# Patient Record
Sex: Female | Born: 1960 | Race: White | Hispanic: No | Marital: Married | Smoking: Never smoker
Health system: Southern US, Community
[De-identification: ages and names within clinical notes are randomized; demographics above are authoritative.]

## PROBLEM LIST (undated history)

## (undated) DIAGNOSIS — I10 Essential (primary) hypertension: Secondary | ICD-10-CM

## (undated) DIAGNOSIS — E7800 Pure hypercholesterolemia, unspecified: Secondary | ICD-10-CM

## (undated) HISTORY — PX: DILATION AND CURETTAGE OF UTERUS: SHX78

## (undated) HISTORY — PX: TONSILLECTOMY AND ADENOIDECTOMY: SHX28

## (undated) HISTORY — DX: Pure hypercholesterolemia, unspecified: E78.00

## (undated) HISTORY — DX: Essential (primary) hypertension: I10

---

## 1994-01-05 HISTORY — PX: KNEE SURGERY: SHX244

## 1998-12-02 ENCOUNTER — Other Ambulatory Visit: Admission: RE | Admit: 1998-12-02 | Discharge: 1998-12-02 | Payer: Self-pay | Admitting: Obstetrics and Gynecology

## 1999-12-18 ENCOUNTER — Other Ambulatory Visit: Admission: RE | Admit: 1999-12-18 | Discharge: 1999-12-18 | Payer: Self-pay | Admitting: Obstetrics and Gynecology

## 2001-01-17 ENCOUNTER — Other Ambulatory Visit: Admission: RE | Admit: 2001-01-17 | Discharge: 2001-01-17 | Payer: Self-pay | Admitting: Obstetrics and Gynecology

## 2002-03-20 ENCOUNTER — Other Ambulatory Visit: Admission: RE | Admit: 2002-03-20 | Discharge: 2002-03-20 | Payer: Self-pay | Admitting: Obstetrics and Gynecology

## 2003-03-19 ENCOUNTER — Other Ambulatory Visit: Admission: RE | Admit: 2003-03-19 | Discharge: 2003-03-19 | Payer: Self-pay | Admitting: Obstetrics and Gynecology

## 2004-03-20 ENCOUNTER — Other Ambulatory Visit: Admission: RE | Admit: 2004-03-20 | Discharge: 2004-03-20 | Payer: Self-pay | Admitting: Obstetrics and Gynecology

## 2007-11-11 ENCOUNTER — Ambulatory Visit: Admission: AD | Admit: 2007-11-11 | Discharge: 2007-11-11 | Payer: Self-pay | Admitting: Obstetrics and Gynecology

## 2007-11-11 ENCOUNTER — Encounter (INDEPENDENT_AMBULATORY_CARE_PROVIDER_SITE_OTHER): Payer: Self-pay | Admitting: Obstetrics and Gynecology

## 2008-04-23 ENCOUNTER — Encounter: Admission: RE | Admit: 2008-04-23 | Discharge: 2008-04-23 | Payer: Self-pay | Admitting: Obstetrics and Gynecology

## 2010-01-26 ENCOUNTER — Encounter: Payer: Self-pay | Admitting: Obstetrics and Gynecology

## 2010-04-04 ENCOUNTER — Other Ambulatory Visit: Payer: Self-pay | Admitting: Obstetrics and Gynecology

## 2010-05-19 ENCOUNTER — Encounter: Payer: Self-pay | Admitting: Genetic Counselor

## 2010-05-20 NOTE — Op Note (Signed)
NAMESHEILIA, Cindy Barr              ACCOUNT NO.:  [id number]   MEDICAL RECORD NO.:  [id number]          PATIENT TYPE:  AMB   LOCATION:  [facility]                          FACILITY:  [facility]   PHYSICIAN:  Kendra H. Tenny Craw, MD     DATE OF BIRTH:  Feb 04, 1960   DATE OF PROCEDURE:  11/11/2007  DATE OF DISCHARGE:                               OPERATIVE REPORT   PREOPERATIVE DIAGNOSES:  1. Menorrhagia.  2. Heterogeneous thickened endometrial lining measuring 2.4 cm on      ultrasound.   POSTOPERATIVE DIAGNOSES:  1. Menorrhagia.  2. Heterogeneous thickened endometrial lining measuring 2.4 cm on      ultrasound.   PROCEDURE:  Dilation and curettage.   SURGEON:  Freddrick March. Tenny Craw, MD   ASSISTANT:  None.   ANESTHESIA:  MAC.   OPERATIVE FINDINGS:  Moderate amount of endometrial curettings.  Both  pink and yellow in color.   DISPOSITION:  Specimens to pathology.   ESTIMATED BLOOD LOSS:  Minimal.   COMPLICATIONS:  None.   PROCEDURE:  Cindy Barr is a 50 year old G2, P2 who presents with a  several year history of irregular menstrual bleeding.  She had not  experienced menstrual cycle for 74 days until Sunday of last week when  she started having irregular bleeding which at times was heavy with  passage of large clots.  This persisted throughout the week and today  she presented with concern for worsening bleeding.  A transvaginal  ultrasound was performed in the office which demonstrated a  heterogeneously thickened endometrial lining at its thickest point  measured 2.4 cm with a nonuniform appearance.  No enhancement with  Doppler was noted.  Given the continued bleeding and the abnormally  thickened uterine lining, the decision was made to proceed with dilation  and curettage for diagnostic and therapeutic purposes.  Following the  appropriate informed consent, the patient was brought to the operating  room where MAC anesthesia was administered and found to be adequate.  She was placed in  the dorsal lithotomy position, prepped and draped in  the normal sterile fashion.  A speculum was placed in the vagina.  A  single-toothed tenaculum was used to grasp the cervix and 10 mL of 1%  lidocaine was infiltrated into the cervix in a paracervical fashion.  The cervix was then serially dilated and several sharp curettage passes  were performed until a gritty texture was noted.  No irregular contours  of the intrauterine cavity were noted.  Endometrial curettings  demonstrated both yellow somewhat necrotic-appearing endometrium and  pink endometrium.  These were sent for pathologic evaluation.  This  completed the surgical procedure.  A single-tooth tenaculum was removed  from the anterior lip of  the cervix.  The speculum was removed from the vagina.  The patient was  taken out of the lithotomy position.  Anesthesia was reversed and the  patient was brought to the recovery room in stable condition following  the procedure.      Freddrick March. Tenny Craw, MD  Electronically Signed     KHR/MEDQ  D:  11/11/2007  T:  11/12/2007  Job:  [id number]

## 2010-10-07 LAB — TYPE AND SCREEN
ABO/RH(D): O POS
Antibody Screen: NEGATIVE

## 2010-10-07 LAB — CBC
Hemoglobin: 13.3
RBC: 4.43

## 2010-10-07 LAB — PROTIME-INR: INR: 1

## 2010-10-07 LAB — APTT: aPTT: 30

## 2012-05-04 ENCOUNTER — Other Ambulatory Visit: Payer: Self-pay | Admitting: Obstetrics and Gynecology

## 2013-05-17 ENCOUNTER — Other Ambulatory Visit: Payer: Self-pay | Admitting: Obstetrics and Gynecology

## 2013-05-24 ENCOUNTER — Other Ambulatory Visit: Payer: Self-pay | Admitting: Obstetrics and Gynecology

## 2013-05-24 DIAGNOSIS — R928 Other abnormal and inconclusive findings on diagnostic imaging of breast: Secondary | ICD-10-CM

## 2013-06-05 ENCOUNTER — Ambulatory Visit
Admission: RE | Admit: 2013-06-05 | Discharge: 2013-06-05 | Disposition: A | Discharge status: Home / Self Care | Payer: BC Managed Care – PPO | Source: Ambulatory Visit | Attending: Obstetrics and Gynecology | Admitting: Obstetrics and Gynecology

## 2013-06-05 ENCOUNTER — Other Ambulatory Visit: Payer: Self-pay | Admitting: Obstetrics and Gynecology

## 2013-06-05 ENCOUNTER — Encounter (INDEPENDENT_AMBULATORY_CARE_PROVIDER_SITE_OTHER): Payer: Self-pay

## 2013-06-05 DIAGNOSIS — R928 Other abnormal and inconclusive findings on diagnostic imaging of breast: Secondary | ICD-10-CM

## 2014-03-06 ENCOUNTER — Other Ambulatory Visit: Payer: Self-pay | Admitting: Obstetrics and Gynecology

## 2014-03-13 ENCOUNTER — Telehealth: Payer: Self-pay | Admitting: *Deleted

## 2014-03-13 NOTE — Telephone Encounter (Signed)
[facility]ard Reeds[facility]endra Ross office [facility] for new pt appt Friday 3/11 at 0945. Request call pt and call back to our office to confirm message received.

## 2014-03-16 ENCOUNTER — Ambulatory Visit: Payer: BLUE CROSS/BLUE SHIELD | Attending: Gynecology | Admitting: Gynecology

## 2014-03-16 ENCOUNTER — Encounter: Payer: Self-pay | Admitting: Gynecology

## 2014-03-16 VITALS — BP 150/81 | HR 75 | Temp 98.3°F | Resp 18 | Ht 59.0 in | Wt 115.9 lb

## 2014-03-16 DIAGNOSIS — Z79899 Other long term (current) drug therapy: Secondary | ICD-10-CM | POA: Insufficient documentation

## 2014-03-16 DIAGNOSIS — Z880 Allergy status to penicillin: Secondary | ICD-10-CM | POA: Insufficient documentation

## 2014-03-16 DIAGNOSIS — E780 Pure hypercholesterolemia: Secondary | ICD-10-CM | POA: Insufficient documentation

## 2014-03-16 DIAGNOSIS — Z881 Allergy status to other antibiotic agents status: Secondary | ICD-10-CM | POA: Diagnosis not present

## 2014-03-16 DIAGNOSIS — Z7982 Long term (current) use of aspirin: Secondary | ICD-10-CM | POA: Diagnosis not present

## 2014-03-16 DIAGNOSIS — I10 Essential (primary) hypertension: Secondary | ICD-10-CM | POA: Diagnosis not present

## 2014-03-16 DIAGNOSIS — Z8041 Family history of malignant neoplasm of ovary: Secondary | ICD-10-CM | POA: Diagnosis not present

## 2014-03-16 DIAGNOSIS — N8320 Unspecified ovarian cysts: Secondary | ICD-10-CM | POA: Diagnosis not present

## 2014-03-16 DIAGNOSIS — N83209 Unspecified ovarian cyst, unspecified side: Secondary | ICD-10-CM | POA: Insufficient documentation

## 2014-03-16 DIAGNOSIS — R971 Elevated cancer antigen 125 [CA 125]: Secondary | ICD-10-CM | POA: Diagnosis not present

## 2014-03-16 DIAGNOSIS — N83201 Unspecified ovarian cyst, right side: Secondary | ICD-10-CM

## 2014-03-16 DIAGNOSIS — Z803 Family history of malignant neoplasm of breast: Secondary | ICD-10-CM | POA: Diagnosis not present

## 2014-03-16 NOTE — Progress Notes (Signed)
Consult Note: Gyn-Onc   Cindy Barr 54 y.o. female  Chief Complaint  Patient presents with  . Ovarian Cyst    Assessment : Small but enlarging left ovarian cyst with thick wall and flow. Further, CA-125 is risen from 38 units per mL 251 units per mL. To my opinion that we need to be certain that this does not represent a small early malignancy of the ovary.  Plan: I recommend the patient undergo laparoscopic left salpingo-oophorectomy with intraoperative frozen section. Should this be benign then no further surgery would be necessary. On the other hand, if this is a malignancy the patient and her husband wish to proceed with exploratory laparotomy and surgical staging under the same anesthesia. The extensive surgery in this situation includes total abdominal hysterectomy bilateral salpingo-oophorectomy omentectomy and lymphadenectomy. The risks of surgery were outlined including hemorrhage, infection, injury to adjacent viscera, thrombolic comp locations, and anesthetic risks. Patient her husband are in agreement with this plan and all questions are answered.  The patient wishes to proceed with surgery as soon as possible and we will therefore schedule it to be performed at [facility] on 03/20/2014.  HPI: 54 year old white married female seen in consultation request of Dr. Waynard Reeds regarding management of a left ovarian cyst. The patient initially presented with postmenopausal bleeding and in the course of evaluation an ultrasound was performed on 02/13/2014. Small bilateral simple ovarian cysts were identified. CA-125 at that time was 38 units per mL. A follow-up ultrasound on 03/06/2014 showed that the left ovarian cyst had increased in size now measuring 3.4 x 1.9 x 2.9 cm. Importantly, septations were thickened and there was flow in the septation. CA-125 also was elevated at 51 units per mL. (CEA was normal at 2.5). Endometrial biopsy revealed endometrial glands with stroma and  features of atrophy.  The patient denies any abdominal discomfort and bloating or pressure. She has a family history revealing a mother with breast cancer and a maternal aunt with ovarian cancer. Obstetrical history gravida 2.  Review of Systems:10 point review of systems is negative except as noted in interval history.   Vitals: Blood pressure 150/81, pulse 75, temperature 98.3 F (36.8 C), temperature source Oral, resp. rate 18, height  (1.499 m), weight 115 lb 14.4 oz (52.572 kg).  Physical Exam: General : The patient is a healthy woman in no acute distress.  HEENT: normocephalic, extraoccular movements normal; neck is supple without thyromegally  Lynphnodes: Supraclavicular and inguinal nodes not enlarged  Abdomen: Soft, non-tender, no ascites, no organomegally, no masses, no hernias  Pelvic:  EGBUS: Normal female  Vagina: Normal, no lesions  Urethra and Bladder: Normal, non-tender  Cervix: Normal there is no bleeding today. Uterus: Anterior normal shape size and consistency. Bi-manual examination: Non-tender; no adenxal masses or nodularity  Rectal: normal sphincter tone, no masses, no blood  Lower extremities: No edema or varicosities. Normal range of motion      Allergies  Allergen Reactions  . Amoxicillin Nausea Only  . Penicillins Nausea Only  . Sulfa Antibiotics Nausea Only    Past Medical History  Diagnosis Date  . Hypertension   . High cholesterol     Past Surgical History  Procedure Laterality Date  . Tonsillectomy and adenoidectomy    . Dilation and curettage of uterus    . Knee surgery Left 1996    Current Outpatient Prescriptions  Medication Sig Dispense Refill  . Aspirin-Acetaminophen-Caffeine (EXCEDRIN PO) Take 1 tablet by mouth as needed.    Marland Kitchen  atorvastatin (LIPITOR) 10 MG tablet Take 10 mg by mouth daily at 6 PM.    . busPIRone (BUSPAR) 15 MG tablet Take 15 mg by mouth 2 (two) times daily.     Marland Kitchen. Fexofenadine HCl (MUCINEX ALLERGY PO) Take 1  tablet by mouth as needed.    . nisoldipine (SULAR) 17 MG 24 hr tablet Take 17 mg by mouth daily.      No current facility-administered medications for this visit.    History   Social History  . Marital Status: Married    Spouse Name: N/A  . Number of Children: N/A  . Years of Education: N/A   Occupational History  . Not on file.   Social History Main Topics  . Smoking status: Never Smoker   . Smokeless tobacco: Not on file  . Alcohol Use: Yes     Comment: wine - once a week  . Drug Use: No  . Sexual Activity: Not Currently   Other Topics Concern  . Not on file   Social History Narrative  . No narrative on file    Family History  Problem Relation Age of Onset  . Lung cancer Mother   . Breast cancer Mother   . Ovarian cancer Maternal Aunt   . Colon cancer Paternal Grandfather       Jeannette CorpusLARKE-PEARSON,Anwita Mencer L, MD 03/16/2014, 10:07 AM

## 2014-03-16 NOTE — Patient Instructions (Signed)
[facility] with Dr. Stanford Breedlarke-Pearson. We will call you with definite date for surgery. [facility]t prior to surgery.

## 2014-04-20 ENCOUNTER — Ambulatory Visit: Payer: BLUE CROSS/BLUE SHIELD | Attending: Gynecology | Admitting: Gynecology

## 2014-04-20 DIAGNOSIS — N801 Endometriosis of ovary: Secondary | ICD-10-CM | POA: Diagnosis not present

## 2014-04-20 DIAGNOSIS — N83201 Unspecified ovarian cyst, right side: Secondary | ICD-10-CM

## 2014-04-20 DIAGNOSIS — N80109 Endometriosis of ovary, unspecified side, unspecified depth: Secondary | ICD-10-CM

## 2014-04-20 DIAGNOSIS — N8320 Unspecified ovarian cysts: Secondary | ICD-10-CM

## 2014-04-20 NOTE — Patient Instructions (Signed)
You may return to full levels of activity. Please return to the care of Dr. Tenny Crawoss for gynecologic care.

## 2014-04-20 NOTE — Progress Notes (Signed)
Consult Note: Gyn-Onc   Cindy Barr A Riviello 54 y.o. female  Chief Complaint  Patient presents with  . Cyst right ovary    Assessment : Status post laparoscopic bilateral salpingo-oophorectomy for benign ovarian cyst, small Brenner tumor, and endometriosis. Patient's had an incompetent postoperative recovery and is given the okay to return to full levels of activity. She will return to the care of her primary gynecologist Dr. Tenny Crawoss for continuing gynecologic management.  HPI: 54 year old white married female seen in consultation request of Dr. Waynard ReedsKendra Ross regarding management of a left ovarian cyst. The patient initially presented with postmenopausal bleeding and in the course of evaluation an ultrasound was performed on 02/13/2014. Small bilateral simple ovarian cysts were identified. CA-125 at that time was 38 units per mL. A follow-up ultrasound on 03/06/2014 showed that the left ovarian cyst had increased in size now measuring 3.4 x 1.9 x 2.9 cm. Importantly, septations were thickened and there was flow in the septation. CA-125 also was elevated at 51 units per mL. (CEA was normal at 2.5). Endometrial biopsy revealed endometrial glands with stroma and features of atrophy.  The patient denies any abdominal discomfort and bloating or pressure. She has a family history revealing a mother with breast cancer and a maternal aunt with ovarian cancer. Obstetrical history gravida 2.  Patient [facility] Her postoperative course was uncomplicated. Final pathology revealed a benign ovarian cyst, small Brenner tumor, and endometriosis.     Review of Systems:10 point review of systems is negative except as noted in interval history.   Vitals: There were no vitals taken for this visit.  Physical Exam: General : The patient is a healthy woman in no acute distress.  HEENT: normocephalic, extraoccular movements normal; neck is supple without  thyromegally  Lynphnodes: Supraclavicular and inguinal nodes not enlarged  Abdomen: Soft, non-tender, no ascites, no organomegally, no masses, no hernias, Steri-Strips removed and all incisions are healing well. Pelvic:  Deferred  Lower extremities: No edema or varicosities. Normal range of motion      Allergies  Allergen Reactions  . Amoxicillin Nausea Only  . Penicillins Nausea Only  . Sulfa Antibiotics Nausea Only    Past Medical History  Diagnosis Date  . Hypertension   . High cholesterol     Past Surgical History  Procedure Laterality Date  . Tonsillectomy and adenoidectomy    . Dilation and curettage of uterus    . Knee surgery Left 1996    Current Outpatient Prescriptions  Medication Sig Dispense Refill  . docusate sodium (COLACE) 100 MG capsule Take 100 mg by mouth.    Marland Kitchen. ibuprofen (ADVIL,MOTRIN) 800 MG tablet Take 800 mg by mouth.    . oxyCODONE-acetaminophen (PERCOCET/ROXICET) 5-325 MG per tablet 1-2 tabs PO q4-6 hours prn pain    . Aspirin-Acetaminophen-Caffeine (EXCEDRIN PO) Take 1 tablet by mouth as needed.    Marland Kitchen. atorvastatin (LIPITOR) 10 MG tablet Take 10 mg by mouth daily at 6 PM.    . busPIRone (BUSPAR) 15 MG tablet Take 15 mg by mouth 2 (two) times daily.     Marland Kitchen. Fexofenadine HCl (MUCINEX ALLERGY PO) Take 1 tablet by mouth as needed.    Marland Kitchen. ibuprofen (ADVIL,MOTRIN) 800 MG tablet   0  . nisoldipine (SULAR) 17 MG 24 hr tablet Take 17 mg by mouth daily.      No current facility-administered medications for this visit.    History   Social History  . Marital Status: Married  Spouse Name: N/A  . Number of Children: N/A  . Years of Education: N/A   Occupational History  . Not on file.   Social History Main Topics  . Smoking status: Never Smoker   . Smokeless tobacco: Not on file  . Alcohol Use: Yes     Comment: wine - once a week  . Drug Use: No  . Sexual Activity: Not Currently   Other Topics Concern  . Not on file   Social History Narrative   . No narrative on file    Family History  Problem Relation Age of Onset  . Lung cancer Mother   . Breast cancer Mother   . Ovarian cancer Maternal Aunt   . Colon cancer Paternal Grandfather       Jeannette Corpus, MD 04/20/2014, 1:48 PM

## 2014-10-11 ENCOUNTER — Ambulatory Visit: Payer: BLUE CROSS/BLUE SHIELD | Admitting: Dietician

## 2014-11-06 ENCOUNTER — Encounter: Payer: BLUE CROSS/BLUE SHIELD | Attending: Family Medicine | Admitting: Dietician

## 2014-11-06 ENCOUNTER — Encounter: Payer: Self-pay | Admitting: Dietician

## 2014-11-06 VITALS — Ht 60.0 in | Wt 113.2 lb

## 2014-11-06 DIAGNOSIS — R7303 Prediabetes: Secondary | ICD-10-CM | POA: Insufficient documentation

## 2014-11-06 DIAGNOSIS — Z713 Dietary counseling and surveillance: Secondary | ICD-10-CM | POA: Diagnosis not present

## 2014-11-06 NOTE — Progress Notes (Signed)
  Medical Nutrition Therapy:  Appt start time: 305 end time:  415  Assessment:  Primary concerns today: [name] states that she was referred for prediabetes. HgbA1c 6.4%. She reports that diabetes runs in her family. [name] states that she has always exercised and maintained a healthy weight and tries to eat a balanced diet. She feels like the stress that she is feeling in her personal life is contributing to high blood sugars.   Preferred Learning Style:   No preference indicated   Learning Readiness:   Ready   MEDICATIONS: see list   DIETARY INTAKE:  Avoided foods include raw onions, anchovies  24-hr recall:  Snk (AM): cashews and a kiwi and DanActive yogurt drink and vitamins B ( AM): [place] yogurt, banana, unsweetened applesauce, cinnamon, sometimes with honey or cereal, and rinsed fruit cup Snk ( AM):  Sometimes dehydrated fruit L ( PM): salad with chicken, salmon or chicken salad sandwich Snk ( PM): sometimes dehydrated fruit D ( PM): *see lunch Snk ( PM):   Beverages: wine on weekends, water "all the time," decaf peppermint and green tea with honey (working on having less honey)   Usual physical activity: yoga 2x a week, pilates 1x a week, active overall  Estimated energy needs: 1600-1800 calories 180-200 g carbohydrates 120-135 g protein 44-50 g fat  Progress Towards Goal(s):  In progress.   Nutritional Diagnosis:  Nesbitt-2.2 Altered nutrition-related laboratory As related to family history of type 2 diabetes and excessive carbohydrate intake.  As evidenced by HgbA1c 6.4%.    Intervention:  Nutrition counseling provided. Reviewed macronutrient metabolism. Explained food label with emphasis on serving size, total carbohydrate, sugar, and fiber. Encouraged protein foods with each meal and snack. Praised patient on regular meal pattern and exercise routine.  Teaching Method Utilized:  Visual Auditory Hands on  Handouts given during visit include:  Meal planning  card  Barriers to learning/adherence to lifestyle change: stress  Demonstrated degree of understanding via:  Teach Back   Monitoring/Evaluation:  Dietary intake, exercise, labs, and body weight prn.

## 2014-11-06 NOTE — Patient Instructions (Signed)
-  Include a protein food with most meals and snacks -Continue your exercise routine

## 2014-11-07 ENCOUNTER — Encounter: Payer: Self-pay | Admitting: Dietician

## 2015-03-08 ENCOUNTER — Other Ambulatory Visit: Payer: Self-pay | Admitting: Obstetrics and Gynecology

## 2015-03-12 LAB — CYTOLOGY - PAP

## 2015-05-08 IMAGING — MG MM DIAGNOSTIC UNILATERAL R
2 series · 2 of 2 positions shown · non-contrast
Comparison: With priors.

CLINICAL DATA: Abnormal right screening mammogram.

EXAM:
DIGITAL DIAGNOSTIC  RIGHT MAMMOGRAM
ULTRASOUND RIGHT BREAST

[R CC]
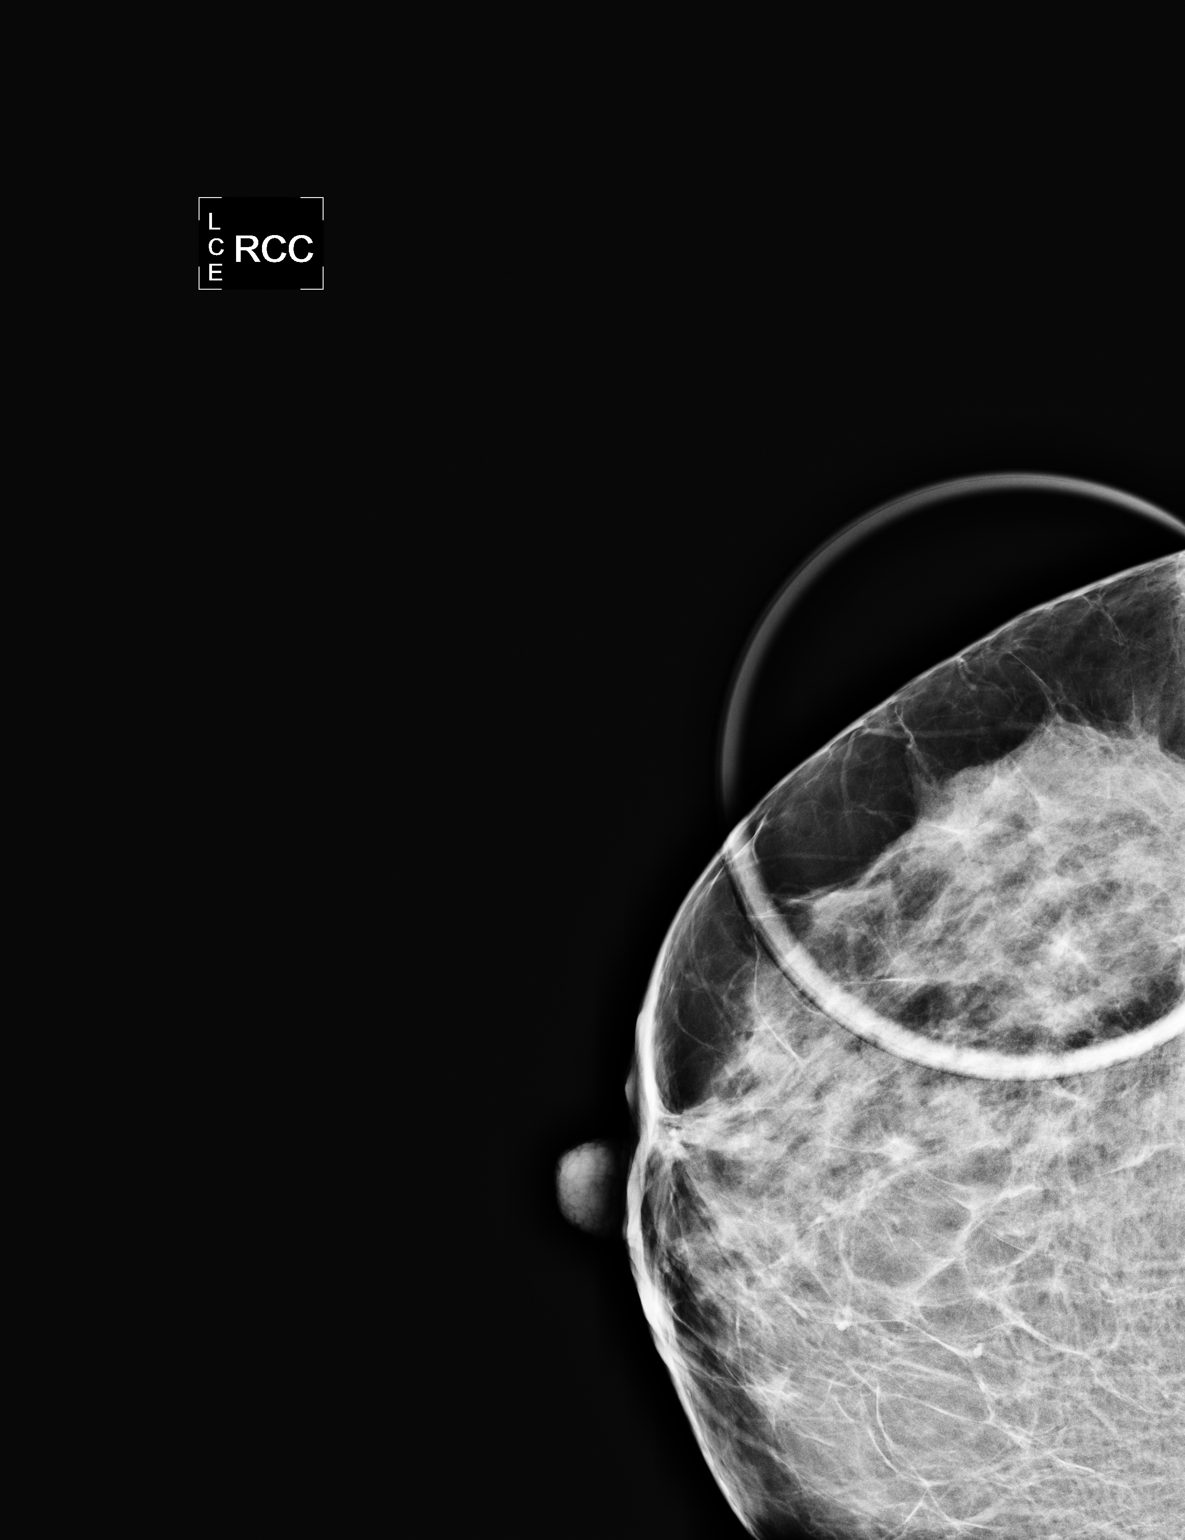

[R MLO]
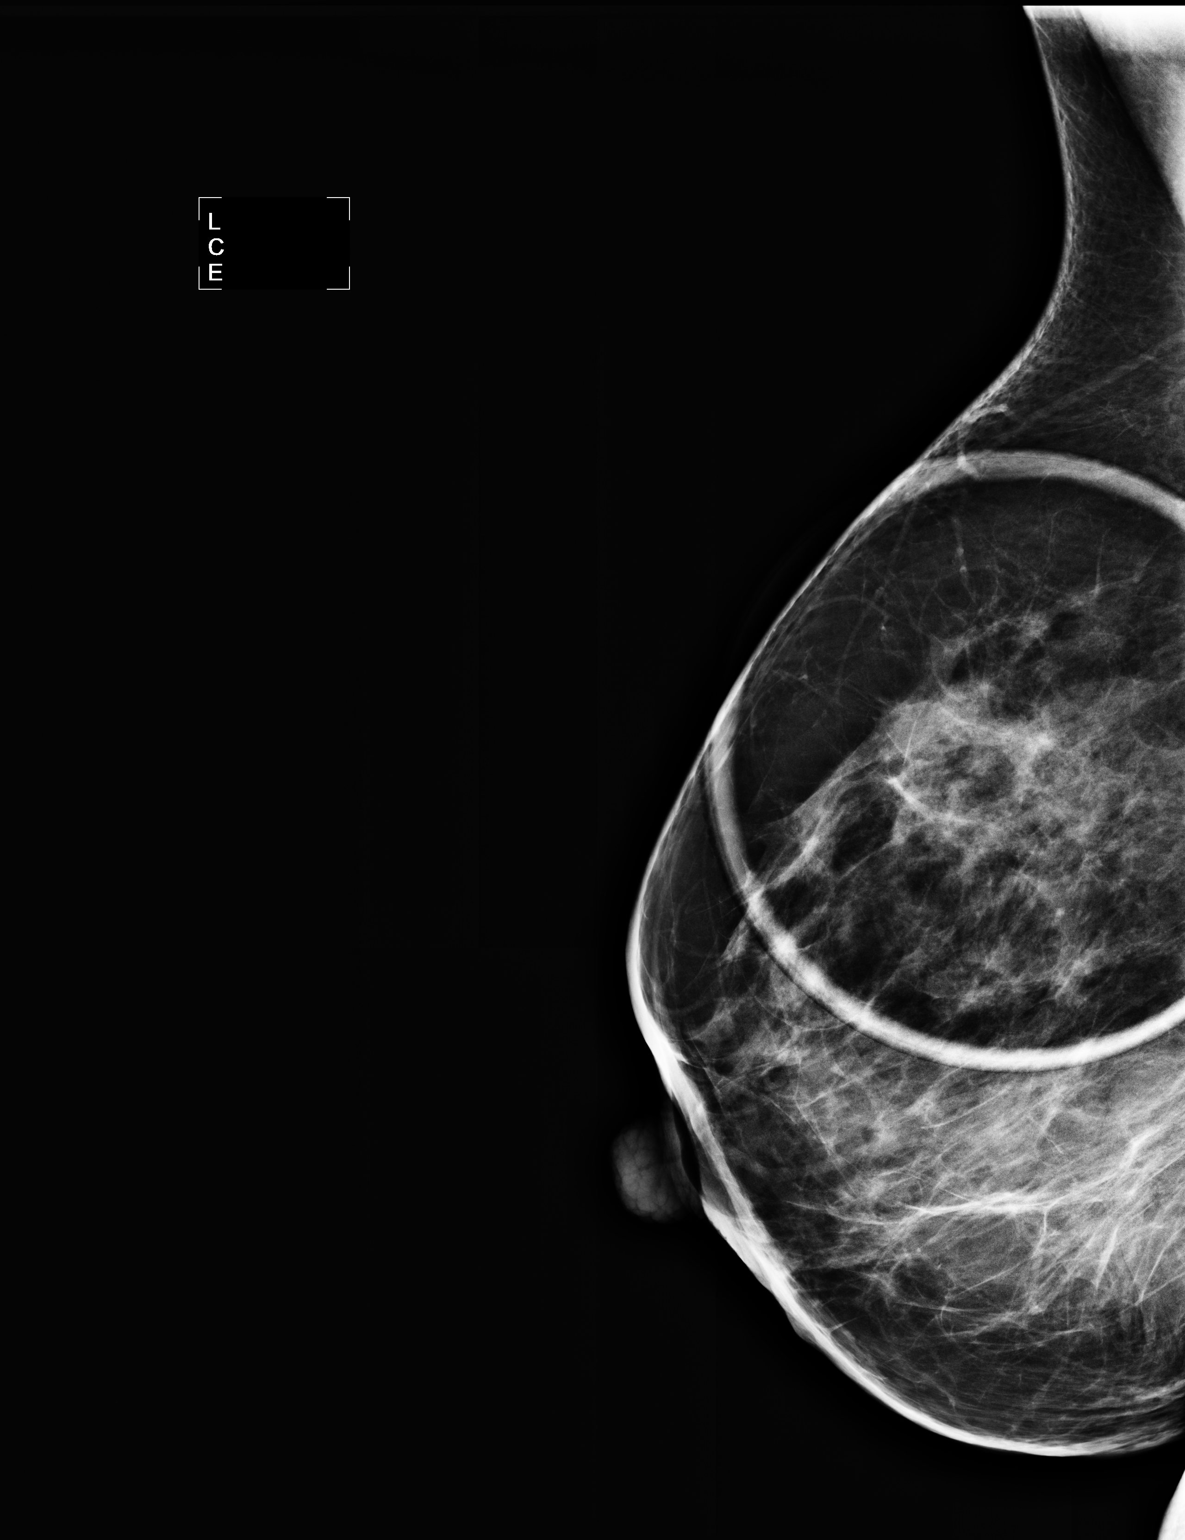

[2 of 2 positions shown; findings below may reference images not displayed]

ACR Breast Density Category b: There are scattered areas of
fibroglandular density.
FINDINGS: Spot compression views of the upper-outer quadrant of the right
breast were obtained. The parenchymal pattern is stable from the
prior exams. No suspicious mass or malignant type
microcalcifications identified.

On physical exam, I do not palpate a mass in the right breast.

Ultrasound is performed, showing normal tissue in the upper-outer
quadrant of right breast. No solid or cystic mass, abnormal
shadowing or distortion detected.
IMPRESSION: No evidence of malignancy in the right breast.

RECOMMENDATION:
Bilateral screening mammogram in 1 year is recommended.

I have discussed the findings and recommendations with the patient.
Results were also provided in writing at the conclusion of the
visit. If applicable, a reminder letter will be sent to the patient
regarding the next appointment.

BI-RADS CATEGORY  1: Negative.

## 2016-03-11 ENCOUNTER — Other Ambulatory Visit: Payer: Self-pay | Admitting: Obstetrics and Gynecology

## 2016-03-12 LAB — CYTOLOGY - PAP

## 2021-10-13 ENCOUNTER — Ambulatory Visit: Payer: 59 | Admitting: Orthopedic Surgery

## 2021-10-13 ENCOUNTER — Encounter: Payer: Self-pay | Admitting: Orthopedic Surgery

## 2021-10-13 ENCOUNTER — Ambulatory Visit (INDEPENDENT_AMBULATORY_CARE_PROVIDER_SITE_OTHER): Payer: 59

## 2021-10-13 VITALS — BP 161/73 | HR 87 | Ht 60.0 in | Wt 107.0 lb

## 2021-10-13 DIAGNOSIS — M545 Low back pain, unspecified: Secondary | ICD-10-CM

## 2021-10-13 DIAGNOSIS — M5416 Radiculopathy, lumbar region: Secondary | ICD-10-CM

## 2021-10-13 NOTE — Progress Notes (Signed)
Orthopedic Spine Surgery Office Note  Assessment: Patient is a 61 y.o. female with chronic low back pain and radicular left leg pain that seems to be in a L3 distribution and has acutely worsening within the last couple of weeks   Plan: -Explained that initially conservative treatment is tried as a significant number of patients may experience relief with these treatment modalities. Discussed that the conservative treatments include:  -activity modification  -physical therapy  -over the counter pain medications  -medrol dosepak  -lumbar steroid injections -Patient has tried tizanidine, diclofenac, gabapentin, activity modification -She can continue with the medication she was previously prescribed -Recommended a course of physical therapy.  A referral was provided to her today. -Patient should return to office in 6 weeks, repeat x-rays of lumbar spine at next visit: None   Patient expressed understanding of the plan and all questions were answered to their satisfaction.   ___________________________________________________________________________   History:  Patient is a 61 y.o. female who presents today for lumbar spine.  Patient reports a fall while walking about 3 weeks ago.  She states that she was able to get up and continue with her activities without any issue.  She then was working with some potted plants about 2 weeks ago doing a lot of lifting and twisting but did not have any pain at that time.  She was working with the potted plants on Tuesday and then she states on Friday she noticed low back pain that radiated into her left leg.  She felt that radiate into the anterior thigh.  It never radiated past the knee.  She had no symptoms on the right side.  She has had similar pain for a long time, but it has gotten significant worse in terms of severity of pain within the last 2 weeks. She went to an urgent care where she tells me that kidney stones were ruled out since she has a  history of kidney stones. She was prescribed several medications at that urgent care visit.  These medications seem to be helping.  She is feeling much better today as opposed to a couple days ago.   Weakness: Denies Symptoms of imbalance: Denies Paresthesias and numbness: Denies Bowel or bladder incontinence: Denies Saddle anesthesia: Denies  Treatments tried: Diclofenac, tizanidine, activity modification, gabapentin  Review of systems: Denies fevers and chills, night sweats, unexplained weight loss, history of cancer. Has had pain in her lower back that radiates into the left left leg but that has improved.   Past medical history: Hyperlipidemia HTN Anxiety Diabetes Kidney stones  Allergies: Sulfa, penicillins  Past surgical history:  Tonsillectomy Wisdom teeth extraction Knee surgery Salpingo-oophorectomy  Social history: Denies use of nicotine product (smoking, vaping, patches, smokeless) Alcohol use: Yes, 3/week Denies recreational drug use   Physical Exam:  General: no acute distress, appears stated age Neurologic: alert, answering questions appropriately, following commands Respiratory: unlabored breathing on room air, symmetric chest rise Psychiatric: appropriate affect, normal cadence to speech   MSK (spine):  -Strength exam      Left  Right EHL    5/5  5/5 TA    5/5  5/5 GSC    5/5  5/5 Knee extension  5/5  5/5 Hip flexion   5/5  5/5  -Sensory exam    Sensation intact to light touch in L3-S1 nerve distributions of bilateral lower extremities  -Achilles DTR: 2/4 on the left, 2/4 on the right -Patellar tendon DTR: 2/4 on the left, 2/4 on the right  -  Straight leg raise: Negative -Contralateral straight leg raise: Negative -Femoral nerve stretch test: Negative -Clonus: no beats bilaterally  -Left hip exam: Negative Stinchfield, no pain through full range of motion, negative Pearlean Brownie, negative FADIR -Right hip exam: Negative Stinchfield, no pain  through full range of motion, negative Pearlean Brownie, negative FADIR  Imaging: X-ray of the lumbar spine from 10/13/2021 was independently reviewed and interpreted, showing 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  There is a 1.5 mm difference in the listhesis between flexion extension.  No acute osseous abnormality.    Patient name: Cindy Barr Patient MRN: [id number] Date of visit: 10/13/21

## 2021-10-16 ENCOUNTER — Telehealth: Payer: Self-pay | Admitting: Orthopedic Surgery

## 2021-10-16 NOTE — Telephone Encounter (Signed)
Pt called requesting diclofenac. Pt states they gave her a small script. Please send to [facility]. Please call pt at [phone].

## 2021-10-17 NOTE — Telephone Encounter (Signed)
Called and advised pt.

## 2021-10-17 NOTE — Telephone Encounter (Signed)
Please advise 

## 2021-10-27 ENCOUNTER — Ambulatory Visit: Payer: 59 | Attending: Orthopedic Surgery

## 2021-10-27 DIAGNOSIS — M5459 Other low back pain: Secondary | ICD-10-CM | POA: Insufficient documentation

## 2021-10-27 DIAGNOSIS — M6281 Muscle weakness (generalized): Secondary | ICD-10-CM | POA: Diagnosis present

## 2021-10-27 DIAGNOSIS — M5416 Radiculopathy, lumbar region: Secondary | ICD-10-CM | POA: Diagnosis not present

## 2021-10-27 NOTE — Therapy (Signed)
OUTPATIENT PHYSICAL THERAPY THORACOLUMBAR EVALUATION   Patient Name: Cindy Barr MRN: [id number] DOB:05/21/1960, 61 y.o., female Today's Date: 10/27/2021   PT End of Session - 10/27/21 1402     Visit Number 1    Date for PT Re-Evaluation 01/05/22    PT Start Time 1400    PT Stop Time 1445    PT Time Calculation (min) 45 min    Activity Tolerance Patient tolerated treatment well    Behavior During Therapy [facility] for tasks assessed/performed             Past Medical History:  Diagnosis Date   High cholesterol    Hypertension    Past Surgical History:  Procedure Laterality Date   DILATION AND CURETTAGE OF UTERUS     KNEE SURGERY Left 1996   TONSILLECTOMY AND ADENOIDECTOMY     Patient Active Problem List   Diagnosis Date Noted   Ovarian cyst 03/16/2014    PCP: Ileene Rubens  REFERRING PROVIDER: Ileene Rubens  REFERRING DIAG: M56.16  Rationale for Evaluation and Treatment Rehabilitation  THERAPY DIAG:  Radiculopathy, lumbar region  Muscle weakness (generalized)  Other low back pain  ONSET DATE: 10/13/21  SUBJECTIVE:                                                                                                                                                                                           SUBJECTIVE STATEMENT: I am much much better than when I had to go to urgent care. The pain was really bad down into my left leg.   PAIN:  Are you having pain? Yes: NPRS scale: 3/10 Pain location: low back Pain description: achy Aggravating factors: sitting too long Relieving factors: pain meds, heating pads  PRECAUTIONS: None  WEIGHT BEARING RESTRICTIONS: No  FALLS:  Has patient fallen in last 6 months? Yes. Number of falls 1  LIVING ENVIRONMENT: Lives with: lives with their spouse Lives in: House/apartment Stairs: Yes: Internal: 30 steps; on right going up Has following equipment at home: None  OCCUPATION: N/A  PLOF: Independent  PATIENT  GOALS: to get back to where I was   OBJECTIVE:   DIAGNOSTIC FINDINGS:  10/13/21- Slight left curve of spine on X-ray  SCREENING FOR RED FLAGS: Bowel or bladder incontinence: No Spinal tumors: No Cauda equina syndrome: No Compression fracture: No Abdominal aneurysm: No  COGNITION: Overall cognitive status: Within functional limits for tasks assessed     SENSATION: WFL   POSTURE: rounded shoulders and forward head  PALPATION: "Discomfort" with palpation along lumbar spine  LUMBAR ROM:   AROM eval  Flexion WFL slight tightness  Extension  Mild tightness  Right lateral flexion Tightness in L side  Left lateral flexion Mild tightness  Right rotation WFL  Left rotation WFL   (Blank rows = not tested)  LOWER EXTREMITY ROM:   WFL   LOWER EXTREMITY MMT:    MMT Right eval Left eval  Hip flexion 5 5  Hip extension 4+ 4+  Hip abduction 4+ 4+  Hip adduction    Hip internal rotation 5 5  Hip external rotation 5 5  Knee flexion 4+ 4+  Knee extension 4+ 4+ discomfort in low back   Ankle dorsiflexion    Ankle plantarflexion    Ankle inversion    Ankle eversion     (Blank rows = not tested)  LUMBAR SPECIAL TESTS:  Straight leg raise test: Negative and FABER test: Positive  FUNCTIONAL TESTS:  5 times sit to stand: 10.84s   TODAY'S TREATMENT 10/27/21- Eval and HEP   PATIENT EDUCATION:  Education details: POC and HEP Person educated: Patient Education method: Explanation Education comprehension: verbalized understanding  HOME EXERCISE PROGRAM: Access Code: [email or web address]  Exercises - Supine Bridge  - 1 x daily - 7 x weekly - 2 sets - 10 reps - Clamshell with Resistance  - 1 x daily - 7 x weekly - 2 sets - 10 reps - Supine Lower Trunk Rotation  - 1 x daily - 7 x weekly - 2 sets - 10 reps  ASSESSMENT:  CLINICAL IMPRESSION: Patient is a 61 y.o. female who was seen today for physical therapy evaluation and treatment for low back pain. She states that a few weeks  ago she was in bad shape, but has significantly improved. She is not having as much pain as she was a week ago. She is active and involved in yoga and regularly tries to exercise at home. Patient would like to get back to her PLOF with no pain. She will benefit from skilled PT to be able to return to doing recreational activities and household chores without pain.    REHAB POTENTIAL: Good  CLINICAL DECISION MAKING: Stable/uncomplicated  EVALUATION COMPLEXITY: Low  GOALS: Goals reviewed with patient? Yes  SHORT TERM GOALS: Target date: 12/01/21  Patient will be independent with initial HEP.  Goal status: INITIAL    LONG TERM GOALS: Target date: 01/05/22  Patient will be independent with advanced/ongoing HEP to improve outcomes and carryover.  Goal status: INITIAL  2.  Patient will report 75% improvement in low back pain to improve QOL.  Baseline: 3/10 Goal status: INITIAL  3.  Patient will demonstrate full pain free lumbar ROM to perform ADLs.   Goal status: INITIAL  4.  Patient will demonstrate improved functional strength as demonstrated by 5/5. Goal status: INITIAL   PLAN:  PT FREQUENCY: 1x/week  PT DURATION: 10 weeks  PLANNED INTERVENTIONS: Therapeutic exercises, Therapeutic activity, Neuromuscular re-education, Balance training, Gait training, Patient/Family education, Self Care, Joint mobilization, Dry Needling, Electrical stimulation, Cryotherapy, Moist heat, Traction, Ionotophoresis 4mg /ml Dexamethasone, and Manual therapy.  PLAN FOR NEXT SESSION: stretching and strengthening for LE and low back   [facility], PT 10/27/2021, 2:53 PM

## 2021-11-05 NOTE — Therapy (Signed)
OUTPATIENT PHYSICAL THERAPY THORACOLUMBAR TREATMENT   Patient Name: Cindy Barr MRN: [id number] DOB:15-Mar-1960, 61 y.o., female Today's Date: 11/06/2021   PT End of Session - 11/06/21 1314     Visit Number 2    Date for PT Re-Evaluation 01/05/22    PT Start Time 1314    PT Stop Time 1400    PT Time Calculation (min) 46 min    Activity Tolerance Patient tolerated treatment well    Behavior During Therapy [facility] for tasks assessed/performed              Past Medical History:  Diagnosis Date   High cholesterol    Hypertension    Past Surgical History:  Procedure Laterality Date   DILATION AND CURETTAGE OF UTERUS     KNEE SURGERY Left 1996   TONSILLECTOMY AND ADENOIDECTOMY     Patient Active Problem List   Diagnosis Date Noted   Ovarian cyst 03/16/2014    PCP: Ileene Rubens  REFERRING PROVIDER: Ileene Rubens  REFERRING DIAG: M56.16  Rationale for Evaluation and Treatment Rehabilitation  THERAPY DIAG:  Other low back pain  Muscle weakness (generalized)  Radiculopathy, lumbar region  ONSET DATE: 10/13/21  SUBJECTIVE:                                                                                                                                                                                           SUBJECTIVE STATEMENT: Feeling much better, I am doing my homework at home and am going to start an exercise class with my friend using light bands. The pain comes and goes and gets up to a 5 but right now there is no pain.   PAIN:  Are you having pain? Yes: NPRS scale: 0/10 Pain location: low back Pain description: achy Aggravating factors: sitting too long Relieving factors: pain meds, heating pads  PRECAUTIONS: None  WEIGHT BEARING RESTRICTIONS: No  FALLS:  Has patient fallen in last 6 months? Yes. Number of falls 1  LIVING ENVIRONMENT: Lives with: lives with their spouse Lives in: House/apartment Stairs: Yes: Internal: 30 steps; on right going  up Has following equipment at home: None  OCCUPATION: N/A  PLOF: Independent  PATIENT GOALS: to get back to where I was   OBJECTIVE:   DIAGNOSTIC FINDINGS:  10/13/21- Slight left curve of spine on X-ray  SCREENING FOR RED FLAGS: Bowel or bladder incontinence: No Spinal tumors: No Cauda equina syndrome: No Compression fracture: No Abdominal aneurysm: No  COGNITION: Overall cognitive status: Within functional limits for tasks assessed     SENSATION: WFL   POSTURE: rounded shoulders and forward head  PALPATION: "Discomfort"  with palpation along lumbar spine  LUMBAR ROM:   AROM eval  Flexion WFL slight tightness  Extension Mild tightness  Right lateral flexion Tightness in L side  Left lateral flexion Mild tightness  Right rotation WFL  Left rotation WFL   (Blank rows = not tested)  LOWER EXTREMITY ROM:   WFL   LOWER EXTREMITY MMT:    MMT Right eval Left eval  Hip flexion 5 5  Hip extension 4+ 4+  Hip abduction 4+ 4+  Hip adduction    Hip internal rotation 5 5  Hip external rotation 5 5  Knee flexion 4+ 4+  Knee extension 4+ 4+ discomfort in low back   Ankle dorsiflexion    Ankle plantarflexion    Ankle inversion    Ankle eversion     (Blank rows = not tested)  LUMBAR SPECIAL TESTS:  Straight leg raise test: Negative and FABER test: Positive  FUNCTIONAL TESTS:  5 times sit to stand: 10.84s   TODAY'S TREATMENT 11/06/21 Nustep L5 x44mins  Stretches HS, piriformis, SK2C 30s each side Feet on pball rotations, knees to chest, bridges  Bridges with ball add 2x10  Sidelying clamshells greenTB 2x10 S2S with OHP yellow 2x10  Leg ext 10# 2x10 HS curls 20# 2x10 Calf raises on bar 2x12      10/27/21- Eval and HEP   PATIENT EDUCATION:  Education details: POC and HEP Person educated: Patient Education method: Explanation Education comprehension: verbalized understanding  HOME EXERCISE PROGRAM: Access Code: [email or web address]  Exercises - Supine  Bridge  - 1 x daily - 7 x weekly - 2 sets - 10 reps - Clamshell with Resistance  - 1 x daily - 7 x weekly - 2 sets - 10 reps - Supine Lower Trunk Rotation  - 1 x daily - 7 x weekly - 2 sets - 10 reps  ASSESSMENT:  CLINICAL IMPRESSION: Patient returns with decrease pain levels, her pain is not constant anymore. She is slowly returning back to exercising and dancing. We worked on low back stretching and strengthening today. She does well throughout session.    REHAB POTENTIAL: Good  CLINICAL DECISION MAKING: Stable/uncomplicated  EVALUATION COMPLEXITY: Low  GOALS: Goals reviewed with patient? Yes  SHORT TERM GOALS: Target date: 12/01/21  Patient will be independent with initial HEP.  Goal status: INITIAL    LONG TERM GOALS: Target date: 01/05/22  Patient will be independent with advanced/ongoing HEP to improve outcomes and carryover.  Goal status: INITIAL  2.  Patient will report 75% improvement in low back pain to improve QOL.  Baseline: 3/10 Goal status: INITIAL  3.  Patient will demonstrate full pain free lumbar ROM to perform ADLs.   Goal status: INITIAL  4.  Patient will demonstrate improved functional strength as demonstrated by 5/5. Goal status: INITIAL  PLAN:  PT FREQUENCY: 1x/week  PT DURATION: 10 weeks  PLANNED INTERVENTIONS: Therapeutic exercises, Therapeutic activity, Neuromuscular re-education, Balance training, Gait training, Patient/Family education, Self Care, Joint mobilization, Dry Needling, Electrical stimulation, Cryotherapy, Moist heat, Traction, Ionotophoresis 4mg /ml Dexamethasone, and Manual therapy.  PLAN FOR NEXT SESSION: stretching and strengthening for LE and low back   [facility], PT 11/06/2021, 1:56 PM

## 2021-11-06 ENCOUNTER — Ambulatory Visit: Payer: 59 | Attending: Orthopedic Surgery

## 2021-11-06 DIAGNOSIS — M5459 Other low back pain: Secondary | ICD-10-CM | POA: Insufficient documentation

## 2021-11-06 DIAGNOSIS — M6281 Muscle weakness (generalized): Secondary | ICD-10-CM | POA: Insufficient documentation

## 2021-11-06 DIAGNOSIS — M5416 Radiculopathy, lumbar region: Secondary | ICD-10-CM | POA: Insufficient documentation

## 2021-11-13 ENCOUNTER — Encounter: Payer: Self-pay | Admitting: Physical Therapy

## 2021-11-13 ENCOUNTER — Ambulatory Visit: Payer: 59 | Admitting: Physical Therapy

## 2021-11-13 DIAGNOSIS — M5459 Other low back pain: Secondary | ICD-10-CM

## 2021-11-13 DIAGNOSIS — M6281 Muscle weakness (generalized): Secondary | ICD-10-CM

## 2021-11-13 NOTE — Therapy (Signed)
OUTPATIENT PHYSICAL THERAPY THORACOLUMBAR TREATMENT   Patient Name: Cindy Barr MRN: [id number] DOB:[birth year], 61 y.o., female Today's Date: 11/13/2021   PT End of Session - 11/13/21 1345     Visit Number 3    Date for PT Re-Evaluation 01/05/22    PT Start Time 1345    PT Stop Time 1430    PT Time Calculation (min) 45 min    Activity Tolerance Patient tolerated treatment well    Behavior During Therapy [facility] for tasks assessed/performed              Past Medical History:  Diagnosis Date   High cholesterol    Hypertension    Past Surgical History:  Procedure Laterality Date   DILATION AND CURETTAGE OF UTERUS     KNEE SURGERY Left 1996   TONSILLECTOMY AND ADENOIDECTOMY     Patient Active Problem List   Diagnosis Date Noted   Ovarian cyst 03/16/2014    PCP: Willia Craze  REFERRING PROVIDER: Willia Craze  REFERRING DIAG: M56.16  Rationale for Evaluation and Treatment Rehabilitation  THERAPY DIAG:  Other low back pain  Muscle weakness (generalized)  ONSET DATE: 10/13/21  SUBJECTIVE:                                                                                                                                                                                           SUBJECTIVE STATEMENT: "Amazing"  PAIN:  Are you having pain? Yes: NPRS scale: 0/10 Pain location: low back Pain description: achy Aggravating factors: sitting too long Relieving factors: pain meds, heating pads  PRECAUTIONS: None  WEIGHT BEARING RESTRICTIONS: No  FALLS:  Has patient fallen in last 6 months? Yes. Number of falls 1  LIVING ENVIRONMENT: Lives with: lives with their spouse Lives in: House/apartment Stairs: Yes: Internal: 30 steps; on right going up Has following equipment at home: None  OCCUPATION: N/A  PLOF: Independent  PATIENT GOALS: to get back to where I was   OBJECTIVE:   DIAGNOSTIC FINDINGS:  10/13/21- Slight left curve of spine on  X-ray  SCREENING FOR RED FLAGS: Bowel or bladder incontinence: No Spinal tumors: No Cauda equina syndrome: No Compression fracture: No Abdominal aneurysm: No  COGNITION: Overall cognitive status: Within functional limits for tasks assessed     SENSATION: WFL   POSTURE: rounded shoulders and forward head  PALPATION: "Discomfort" with palpation along lumbar spine  LUMBAR ROM:   AROM eval  Flexion WFL slight tightness  Extension Mild tightness  Right lateral flexion Tightness in L side  Left lateral flexion Mild tightness  Right rotation WFL  Left rotation WFL   (Blank  rows = not tested)  LOWER EXTREMITY ROM:   WFL   LOWER EXTREMITY MMT:    MMT Right eval Left eval  Hip flexion 5 5  Hip extension 4+ 4+  Hip abduction 4+ 4+  Hip adduction    Hip internal rotation 5 5  Hip external rotation 5 5  Knee flexion 4+ 4+  Knee extension 4+ 4+ discomfort in low back   Ankle dorsiflexion    Ankle plantarflexion    Ankle inversion    Ankle eversion     (Blank rows = not tested)  LUMBAR SPECIAL TESTS:  Straight leg raise test: Negative and FABER test: Positive  FUNCTIONAL TESTS:  5 times sit to stand: 10.84s   TODAY'S TREATMENT 11/13/21 NuStep L5 x 6 min Hamstring curls 20lb 2x10 Leg Ext 10lb 2x10 S2S with OHP yellow 2x10  Shouldr Ext 5lb 2x10 Standing rows 10lb 2x10 Seated rows & Lats 20lb 2x10 Bridges x15  Feet on pball rotations, knees to chest, bridges  Stretches HS, piriformis, SK2C 30s each side  11/06/21 Nustep L5 x54mins  Stretches HS, piriformis, SK2C 30s each side Feet on pball rotations, knees to chest, bridges  Bridges with ball add 2x10  Sidelying clamshells greenTB 2x10 S2S with OHP yellow 2x10  Leg ext 10# 2x10 HS curls 20# 2x10 Calf raises on bar 2x12      10/27/21- Eval and HEP   PATIENT EDUCATION:  Education details: POC and HEP Person educated: Patient Education method: Explanation Education comprehension: verbalized  understanding  HOME EXERCISE PROGRAM: Access Code: [email or web address]  Exercises - Supine Bridge  - 1 x daily - 7 x weekly - 2 sets - 10 reps - Clamshell with Resistance  - 1 x daily - 7 x weekly - 2 sets - 10 reps - Supine Lower Trunk Rotation  - 1 x daily - 7 x weekly - 2 sets - 10 reps  ASSESSMENT:  CLINICAL IMPRESSION: Again patient returns with decrease pain levels, her pain is not constant anymore. She is slowly returning back to exercising and dancing. Continued to worked on low back stretching and strengthening today. Cue for core engagement needed with  standing shoulder extensions.    REHAB POTENTIAL: Good  CLINICAL DECISION MAKING: Stable/uncomplicated  EVALUATION COMPLEXITY: Low  GOALS: Goals reviewed with patient? Yes  SHORT TERM GOALS: Target date: 12/01/21  Patient will be independent with initial HEP.  Goal status: INITIAL    LONG TERM GOALS: Target date: 01/05/22  Patient will be independent with advanced/ongoing HEP to improve outcomes and carryover.  Goal status: INITIAL  2.  Patient will report 75% improvement in low back pain to improve QOL.  Baseline: 3/10 Goal status: Progressing   3.  Patient will demonstrate full pain free lumbar ROM to perform ADLs.   Goal status: INITIAL  4.  Patient will demonstrate improved functional strength as demonstrated by 5/5. Goal status: INITIAL  PLAN:  PT FREQUENCY: 1x/week  PT DURATION: 10 weeks  PLANNED INTERVENTIONS: Therapeutic exercises, Therapeutic activity, Neuromuscular re-education, Balance training, Gait training, Patient/Family education, Self Care, Joint mobilization, Dry Needling, Electrical stimulation, Cryotherapy, Moist heat, Traction, Ionotophoresis 4mg /ml Dexamethasone, and Manual therapy.  PLAN FOR NEXT SESSION: stretching and strengthening for LE and low back   , PTA 11/13/2021, 1:46 PM

## 2021-11-20 ENCOUNTER — Ambulatory Visit: Payer: 59 | Admitting: Physical Therapy

## 2021-11-24 ENCOUNTER — Ambulatory Visit: Payer: 59 | Admitting: Orthopedic Surgery

## 2021-12-01 ENCOUNTER — Ambulatory Visit: Payer: 59 | Admitting: Orthopedic Surgery

## 2021-12-04 ENCOUNTER — Ambulatory Visit: Payer: 59

## 2021-12-09 NOTE — Therapy (Signed)
OUTPATIENT PHYSICAL THERAPY THORACOLUMBAR TREATMENT   Patient Name: Cindy Barr MRN: [id number] DOB:Jun 27, 1960, 61 y.o., female Today's Date: 12/10/2021   PT End of Session - 12/10/21 1316     Visit Number 4    Date for PT Re-Evaluation 01/05/22    PT Start Time 1315    PT Stop Time 1400    PT Time Calculation (min) 45 min    Activity Tolerance Patient tolerated treatment well    Behavior During Therapy [facility] for tasks assessed/performed              Past Medical History:  Diagnosis Date   High cholesterol    Hypertension    Past Surgical History:  Procedure Laterality Date   DILATION AND CURETTAGE OF UTERUS     KNEE SURGERY Left 1996   TONSILLECTOMY AND ADENOIDECTOMY     Patient Active Problem List   Diagnosis Date Noted   Ovarian cyst 03/16/2014    PCP: Willia Craze  REFERRING PROVIDER: Willia Craze  REFERRING DIAG: M56.16  Rationale for Evaluation and Treatment Rehabilitation  THERAPY DIAG:  Other low back pain  Muscle weakness (generalized)  Radiculopathy, lumbar region  ONSET DATE: 10/13/21  SUBJECTIVE:                                                                                                                                                                                           SUBJECTIVE STATEMENT: Doing well, I did a workout this morning. The back is much better, maybe a little tightness but feeling back to normal slowly.   PAIN:  Are you having pain? Yes: NPRS scale: 0/10 Pain location: low back Pain description: achy Aggravating factors: sitting too long Relieving factors: pain meds, heating pads  PRECAUTIONS: None  WEIGHT BEARING RESTRICTIONS: No  FALLS:  Has patient fallen in last 6 months? Yes. Number of falls 1  LIVING ENVIRONMENT: Lives with: lives with their spouse Lives in: House/apartment Stairs: Yes: Internal: 30 steps; on right going up Has following equipment at home: None  OCCUPATION: N/A  PLOF:  Independent  PATIENT GOALS: to get back to where I was   OBJECTIVE:   DIAGNOSTIC FINDINGS:  10/13/21- Slight left curve of spine on X-ray  SCREENING FOR RED FLAGS: Bowel or bladder incontinence: No Spinal tumors: No Cauda equina syndrome: No Compression fracture: No Abdominal aneurysm: No  COGNITION: Overall cognitive status: Within functional limits for tasks assessed     SENSATION: WFL   POSTURE: rounded shoulders and forward head  PALPATION: "Discomfort" with palpation along lumbar spine  LUMBAR ROM:   AROM eval  Flexion WFL slight tightness  Extension  Mild tightness  Right lateral flexion Tightness in L side  Left lateral flexion Mild tightness  Right rotation WFL  Left rotation WFL   (Blank rows = not tested)  LOWER EXTREMITY ROM:   WFL   LOWER EXTREMITY MMT:    MMT Right eval Left eval  Hip flexion 5 5  Hip extension 4+ 4+  Hip abduction 4+ 4+  Hip adduction    Hip internal rotation 5 5  Hip external rotation 5 5  Knee flexion 4+ 4+  Knee extension 4+ 4+ discomfort in low back   Ankle dorsiflexion    Ankle plantarflexion    Ankle inversion    Ankle eversion     (Blank rows = not tested)  LUMBAR SPECIAL TESTS:  Straight leg raise test: Negative and FABER test: Positive  FUNCTIONAL TESTS:  5 times sit to stand: 10.84s   TODAY'S TREATMENT 12/10/21 Bike L3 x28mins  Leg press 20# 2x10 Seated rows 20# 2x10 Lat pull downs 20# 2x10 blackTB ext 2x10 Resisted gait 20# 4x 4 way   11/13/21 NuStep L5 x 6 min Hamstring curls 20lb 2x10 Leg Ext 10lb 2x10 S2S with OHP yellow 2x10  Shouldr Ext 5lb 2x10 Standing rows 10lb 2x10 Seated rows & Lats 20lb 2x10 Bridges x15  Feet on pball rotations, knees to chest, bridges  Stretches HS, piriformis, SK2C 30s each side  11/06/21 Nustep L5 x50mins  Stretches HS, piriformis, SK2C 30s each side Feet on pball rotations, knees to chest, bridges  Bridges with ball add 2x10  Sidelying clamshells greenTB  2x10 S2S with OHP yellow 2x10  Leg ext 10# 2x10 HS curls 20# 2x10 Calf raises on bar 2x12      10/27/21- Eval and HEP   PATIENT EDUCATION:  Education details: POC and HEP Person educated: Patient Education method: Explanation Education comprehension: verbalized understanding  HOME EXERCISE PROGRAM: Access Code: [email or web address]  Exercises - Supine Bridge  - 1 x daily - 7 x weekly - 2 sets - 10 reps - Clamshell with Resistance  - 1 x daily - 7 x weekly - 2 sets - 10 reps - Supine Lower Trunk Rotation  - 1 x daily - 7 x weekly - 2 sets - 10 reps  ASSESSMENT:  CLINICAL IMPRESSION: Again patient returns with decrease pain levels, her pain is not constant anymore. She is slowly returning back to exercising and dancing. Continued to worked on low back stretching and strengthening today. Cue for core engagement needed with  standing shoulder extensions.    REHAB POTENTIAL: Good  CLINICAL DECISION MAKING: Stable/uncomplicated  EVALUATION COMPLEXITY: Low  GOALS: Goals reviewed with patient? Yes  SHORT TERM GOALS: Target date: 12/01/21  Patient will be independent with initial HEP.  Goal status: INITIAL    LONG TERM GOALS: Target date: 01/05/22  Patient will be independent with advanced/ongoing HEP to improve outcomes and carryover.  Goal status: INITIAL  2.  Patient will report 75% improvement in low back pain to improve QOL.  Baseline: 3/10 Goal status: Progressing   3.  Patient will demonstrate full pain free lumbar ROM to perform ADLs.   Goal status: INITIAL  4.  Patient will demonstrate improved functional strength as demonstrated by 5/5. Goal status: INITIAL  PLAN:  PT FREQUENCY: 1x/week  PT DURATION: 10 weeks  PLANNED INTERVENTIONS: Therapeutic exercises, Therapeutic activity, Neuromuscular re-education, Balance training, Gait training, Patient/Family education, Self Care, Joint mobilization, Dry Needling, Electrical stimulation, Cryotherapy, Moist heat,  Traction, Ionotophoresis 4mg /ml Dexamethasone, and Manual therapy.  PLAN  FOR NEXT SESSION: stretching and strengthening for LE and low back, blackTB ext, step ups   Cassie Freer, PT 12/10/2021, 1:59 PM

## 2021-12-10 ENCOUNTER — Ambulatory Visit: Payer: 59 | Attending: Family Medicine

## 2021-12-10 DIAGNOSIS — M6281 Muscle weakness (generalized): Secondary | ICD-10-CM

## 2021-12-10 DIAGNOSIS — M5459 Other low back pain: Secondary | ICD-10-CM | POA: Diagnosis not present

## 2021-12-10 DIAGNOSIS — M5416 Radiculopathy, lumbar region: Secondary | ICD-10-CM

## 2021-12-17 ENCOUNTER — Encounter: Payer: Self-pay | Admitting: Physical Therapy

## 2021-12-17 ENCOUNTER — Ambulatory Visit: Payer: 59 | Admitting: Physical Therapy

## 2021-12-17 DIAGNOSIS — M5459 Other low back pain: Secondary | ICD-10-CM

## 2021-12-17 DIAGNOSIS — M6281 Muscle weakness (generalized): Secondary | ICD-10-CM

## 2021-12-17 DIAGNOSIS — M5416 Radiculopathy, lumbar region: Secondary | ICD-10-CM

## 2021-12-17 NOTE — Therapy (Signed)
OUTPATIENT PHYSICAL THERAPY THORACOLUMBAR TREATMENT   Patient Name: Cindy Barr MRN: [id number] DOB:December 16, 1960, 61 y.o., female Today's Date: 12/17/2021   PT End of Session - 12/17/21 1344     Visit Number 5    Date for PT Re-Evaluation 01/05/22    PT Start Time 1345    PT Stop Time 1430    PT Time Calculation (min) 45 min    Activity Tolerance Patient tolerated treatment well    Behavior During Therapy [facility] for tasks assessed/performed              Past Medical History:  Diagnosis Date   High cholesterol    Hypertension    Past Surgical History:  Procedure Laterality Date   DILATION AND CURETTAGE OF UTERUS     KNEE SURGERY Left 1996   TONSILLECTOMY AND ADENOIDECTOMY     Patient Active Problem List   Diagnosis Date Noted   Ovarian cyst 03/16/2014    PCP: Willia Craze  REFERRING PROVIDER: Willia Craze  REFERRING DIAG: M56.16  Rationale for Evaluation and Treatment Rehabilitation  THERAPY DIAG:  Other low back pain  Muscle weakness (generalized)  Radiculopathy, lumbar region  ONSET DATE: 10/13/21  SUBJECTIVE:                                                                                                                                                                                           SUBJECTIVE STATEMENT: Doing well, I did a workout this morning pilates PAIN:  Are you having pain? Yes: NPRS scale: 0/10 Pain location: low back Pain description: achy Aggravating factors: sitting too long Relieving factors: pain meds, heating pads  PRECAUTIONS: None  WEIGHT BEARING RESTRICTIONS: No  FALLS:  Has patient fallen in last 6 months? Yes. Number of falls 1  LIVING ENVIRONMENT: Lives with: lives with their spouse Lives in: House/apartment Stairs: Yes: Internal: 30 steps; on right going up Has following equipment at home: None  OCCUPATION: N/A  PLOF: Independent  PATIENT GOALS: to get back to where I was   OBJECTIVE:    DIAGNOSTIC FINDINGS:  10/13/21- Slight left curve of spine on X-ray  SCREENING FOR RED FLAGS: Bowel or bladder incontinence: No Spinal tumors: No Cauda equina syndrome: No Compression fracture: No Abdominal aneurysm: No  COGNITION: Overall cognitive status: Within functional limits for tasks assessed     SENSATION: WFL   POSTURE: rounded shoulders and forward head  PALPATION: "Discomfort" with palpation along lumbar spine  LUMBAR ROM:   AROM eval  Flexion WFL slight tightness  Extension Mild tightness  Right lateral flexion Tightness in L side  Left lateral flexion Mild tightness  Right rotation WFL  Left rotation WFL   (Blank rows = not tested)  LOWER EXTREMITY ROM:   WFL   LOWER EXTREMITY MMT:    MMT Right eval Left eval  Hip flexion 5 5  Hip extension 4+ 4+  Hip abduction 4+ 4+  Hip adduction    Hip internal rotation 5 5  Hip external rotation 5 5  Knee flexion 4+ 4+  Knee extension 4+ 4+ discomfort in low back   Ankle dorsiflexion    Ankle plantarflexion    Ankle inversion    Ankle eversion     (Blank rows = not tested)  LUMBAR SPECIAL TESTS:  Straight leg raise test: Negative and FABER test: Positive  FUNCTIONAL TESTS:  5 times sit to stand: 10.84s   TODAY'S TREATMENT 12/17/21 NuStep L5 x 6 min  Seated rows 20# 2x12 Lat pull downs 20# 2x12 blackTB ext 2x10 Leg press 30# 2x10 Shoulder Ext 5lb 2x10  S2S OHP yellow 2x10 LE on Pball bridges K2C, Oblq  12/10/21 Bike L3 x66mins  Leg press 20# 2x10 Seated rows 20# 2x10 Lat pull downs 20# 2x10 blackTB ext 2x10 Resisted gait 20# 4x 4 way   11/13/21 NuStep L5 x 6 min Hamstring curls 20lb 2x10 Leg Ext 10lb 2x10 S2S with OHP yellow 2x10  Shouldr Ext 5lb 2x10 Standing rows 10lb 2x10 Seated rows & Lats 20lb 2x10 Bridges x15  Feet on pball rotations, knees to chest, bridges  Stretches HS, piriformis, SK2C 30s each side  11/06/21 Nustep L5 x55mins  Stretches HS, piriformis, SK2C 30s  each side Feet on pball rotations, knees to chest, bridges  Bridges with ball add 2x10  Sidelying clamshells greenTB 2x10 S2S with OHP yellow 2x10  Leg ext 10# 2x10 HS curls 20# 2x10 Calf raises on bar 2x12      10/27/21- Eval and HEP   PATIENT EDUCATION:  Education details: POC and HEP Person educated: Patient Education method: Explanation Education comprehension: verbalized understanding  HOME EXERCISE PROGRAM: Access Code: [email or web address]  Exercises - Supine Bridge  - 1 x daily - 7 x weekly - 2 sets - 10 reps - Clamshell with Resistance  - 1 x daily - 7 x weekly - 2 sets - 10 reps - Supine Lower Trunk Rotation  - 1 x daily - 7 x weekly - 2 sets - 10 reps  ASSESSMENT:  CLINICAL IMPRESSION: Pt enters without pain, reporting that she has been working out. Cue for core engagement needed with standing shoulder extensions and supine Pball interventons. No reports of pain during session   REHAB POTENTIAL: Good  CLINICAL DECISION MAKING: Stable/uncomplicated  EVALUATION COMPLEXITY: Low  GOALS: Goals reviewed with patient? Yes  SHORT TERM GOALS: Target date: 12/01/21  Patient will be independent with initial HEP.  Goal status: INITIAL    LONG TERM GOALS: Target date: 01/05/22  Patient will be independent with advanced/ongoing HEP to improve outcomes and carryover.  Goal status: INITIAL  2.  Patient will report 75% improvement in low back pain to improve QOL.  Baseline: 3/10 Goal status: Progressing   3.  Patient will demonstrate full pain free lumbar ROM to perform ADLs.   Goal status: INITIAL  4.  Patient will demonstrate improved functional strength as demonstrated by 5/5. Goal status: INITIAL  PLAN:  PT FREQUENCY: 1x/week  PT DURATION: 10 weeks  PLANNED INTERVENTIONS: Therapeutic exercises, Therapeutic activity, Neuromuscular re-education, Balance training, Gait training, Patient/Family education, Self Care, Joint mobilization, Dry Needling, Electrical  stimulation, Cryotherapy, Moist heat,  Traction, Ionotophoresis 4mg /ml Dexamethasone, and Manual therapy.  PLAN FOR NEXT SESSION: stretching and strengthening for LE and low back, blackTB ext, step ups   , PTA 12/17/2021, 1:45 PM

## 2021-12-22 ENCOUNTER — Encounter: Payer: Self-pay | Admitting: Physical Therapy

## 2021-12-22 ENCOUNTER — Ambulatory Visit: Payer: 59 | Admitting: Physical Therapy

## 2021-12-22 DIAGNOSIS — M5416 Radiculopathy, lumbar region: Secondary | ICD-10-CM

## 2021-12-22 DIAGNOSIS — M5459 Other low back pain: Secondary | ICD-10-CM

## 2021-12-22 DIAGNOSIS — M6281 Muscle weakness (generalized): Secondary | ICD-10-CM

## 2021-12-22 NOTE — Therapy (Signed)
OUTPATIENT PHYSICAL THERAPY THORACOLUMBAR TREATMENT   Patient Name: Cindy Barr MRN: [id number] DOB:12/04/1960, 61 y.o., female Today's Date: 12/22/2021   PT End of Session - 12/22/21 1257     Visit Number 6    Date for PT Re-Evaluation 01/05/22    PT Start Time 1300    PT Stop Time 1345    PT Time Calculation (min) 45 min    Activity Tolerance Patient tolerated treatment well    Behavior During Therapy WFL for tasks assessed/performed              Past Medical History:  Diagnosis Date   High cholesterol    Hypertension    Past Surgical History:  Procedure Laterality Date   DILATION AND CURETTAGE OF UTERUS     KNEE SURGERY Left 1996   TONSILLECTOMY AND ADENOIDECTOMY     Patient Active Problem List   Diagnosis Date Noted   Ovarian cyst 03/16/2014    PCP: Michael Moore  REFERRING PROVIDER: Michael Moore  REFERRING DIAG: M56.16  Rationale for Evaluation and Treatment Rehabilitation  THERAPY DIAG:  Other low back pain  Muscle weakness (generalized)  Radiculopathy, lumbar region  ONSET DATE: 10/13/21  SUBJECTIVE:                                                                                                                                                                                           SUBJECTIVE STATEMENT: Feeling fine PAIN:  Are you having pain? Yes: NPRS scale: 0/10 Pain location: low back Pain description: achy Aggravating factors: sitting too long Relieving factors: pain meds, heating pads  PRECAUTIONS: None  WEIGHT BEARING RESTRICTIONS: No  FALLS:  Has patient fallen in last 6 months? Yes. Number of falls 1  LIVING ENVIRONMENT: Lives with: lives with their spouse Lives in: House/apartment Stairs: Yes: Internal: 30 steps; on right going up Has following equipment at home: None  OCCUPATION: N/A  PLOF: Independent  PATIENT GOALS: to get back to where I was   OBJECTIVE:   DIAGNOSTIC FINDINGS:  10/13/21- Slight left  curve of spine on X-ray  SCREENING FOR RED FLAGS: Bowel or bladder incontinence: No Spinal tumors: No Cauda equina syndrome: No Compression fracture: No Abdominal aneurysm: No  COGNITION: Overall cognitive status: Within functional limits for tasks assessed     SENSATION: WFL   POSTURE: rounded shoulders and forward head  PALPATION: "Discomfort" with palpation along lumbar spine  LUMBAR ROM:   AROM eval 12/22/21  Flexion WFL slight tightness WFL  Extension Mild tightness WFL  Right lateral flexion Tightness in L side WFL  Left lateral flexion Mild tightness WFL  Right   rotation WFL WFL  Left rotation WFL WFL   (Blank rows = not tested)  LOWER EXTREMITY ROM:   WFL   LOWER EXTREMITY MMT:    MMT Right eval Left eval  Hip flexion 5 5  Hip extension 4+ 4+  Hip abduction 4+ 4+  Hip adduction    Hip internal rotation 5 5  Hip external rotation 5 5  Knee flexion 4+ 4+  Knee extension 4+ 4+ discomfort in low back   Ankle dorsiflexion    Ankle plantarflexion    Ankle inversion    Ankle eversion     (Blank rows = not tested)  LUMBAR SPECIAL TESTS:  Straight leg raise test: Negative and FABER test: Positive  FUNCTIONAL TESTS:  5 times sit to stand: 10.84s   TODAY'S TREATMENT 12/22/21 NuStep L5 x 6 min S2S OHP blue ball 2x10 Hamstring curls 20lb 2x12 Leg Ext 5lb 2x12 Shoulder Ext 10lb 2x10  Standing rows 10lb 2x10  Seated rows 20# 2x12 Lat pull downs 20# 2x12 LE on Pball bridges K2C, Oblq   12/17/21 NuStep L5 x 6 min  Seated rows 20# 2x12 Lat pull downs 20# 2x12 blackTB ext 2x10 Leg press 30# 2x10 Shoulder Ext 5lb 2x10  S2S OHP yellow 2x10 LE on Pball bridges K2C, Oblq  12/10/21 Bike L3 x6mins  Leg press 20# 2x10 Seated rows 20# 2x10 Lat pull downs 20# 2x10 blackTB ext 2x10 Resisted gait 20# 4x 4 way   11/13/21 NuStep L5 x 6 min Hamstring curls 20lb 2x10 Leg Ext 10lb 2x10 S2S with OHP yellow 2x10  Shouldr Ext 5lb 2x10 Standing rows 10lb  2x10 Seated rows & Lats 20lb 2x10 Bridges x15  Feet on pball rotations, knees to chest, bridges  Stretches HS, piriformis, SK2C 30s each side  11/06/21 Nustep L5 x6mins  Stretches HS, piriformis, SK2C 30s each side Feet on pball rotations, knees to chest, bridges  Bridges with ball add 2x10  Sidelying clamshells greenTB 2x10 S2S with OHP yellow 2x10  Leg ext 10# 2x10 HS curls 20# 2x10 Calf raises on bar 2x12      10/27/21- Eval and HEP   PATIENT EDUCATION:  Education details: POC and HEP Person educated: Patient Education method: Explanation Education comprehension: verbalized understanding  HOME EXERCISE PROGRAM: Access Code: [email or web address]  Exercises - Supine Bridge  - 1 x daily - 7 x weekly - 2 sets - 10 reps - Clamshell with Resistance  - 1 x daily - 7 x weekly - 2 sets - 10 reps - Supine Lower Trunk Rotation  - 1 x daily - 7 x weekly - 2 sets - 10 reps  ASSESSMENT:  CLINICAL IMPRESSION: Pt enters without pain, she has progressed increasing her lumbar AROM Meeting goal. No issues completing today's intervenous.  Cue for core engagement needed with standing shoulder extensions and supine Pball interventons. No reports of pain during session   REHAB POTENTIAL: Good  CLINICAL DECISION MAKING: Stable/uncomplicated  EVALUATION COMPLEXITY: Low  GOALS: Goals reviewed with patient? Yes  SHORT TERM GOALS: Target date: 12/01/21  Patient will be independent with initial HEP.  Goal status: Met    LONG TERM GOALS: Target date: 01/05/22  Patient will be independent with advanced/ongoing HEP to improve outcomes and carryover.  Goal status: INITIAL  2.  Patient will report 75% improvement in low back pain to improve QOL.  Baseline: 3/10 Goal status: Met 12/22/21   3.  Patient will demonstrate full pain free lumbar ROM to perform ADLs.   Goal   status: Met 12/22/21  4.  Patient will demonstrate improved functional strength as demonstrated by 5/5. Goal status:  INITIAL  PLAN:  PT FREQUENCY: 1x/week  PT DURATION: 10 weeks  PLANNED INTERVENTIONS: Therapeutic exercises, Therapeutic activity, Neuromuscular re-education, Balance training, Gait training, Patient/Family education, Self Care, Joint mobilization, Dry Needling, Electrical stimulation, Cryotherapy, Moist heat, Traction, Ionotophoresis 68m/ml Dexamethasone, and Manual therapy.  PLAN FOR NEXT SESSION: stretching and strengthening for LE and low back, blackTB ext, step ups   RScot Jun PTA 12/22/2021, 12:58 PM

## 2022-01-01 ENCOUNTER — Ambulatory Visit: Payer: 59 | Admitting: Physical Therapy

## 2022-01-01 ENCOUNTER — Encounter: Payer: Self-pay | Admitting: Physical Therapy

## 2022-01-01 DIAGNOSIS — M5416 Radiculopathy, lumbar region: Secondary | ICD-10-CM

## 2022-01-01 DIAGNOSIS — M5459 Other low back pain: Secondary | ICD-10-CM

## 2022-01-01 DIAGNOSIS — M6281 Muscle weakness (generalized): Secondary | ICD-10-CM

## 2022-01-01 NOTE — Therapy (Addendum)
OUTPATIENT PHYSICAL THERAPY THORACOLUMBAR TREATMENT   Patient Name: Cindy Barr MRN: [id number] DOB:1960/11/08, 61 y.o., female Today's Date: 01/01/2022   PT End of Session - 01/01/22 1343     Visit Number 7    Date for PT Re-Evaluation 01/05/22    PT Start Time 1345    PT Stop Time 1430    PT Time Calculation (min) 45 min    Activity Tolerance Patient tolerated treatment well    Behavior During Therapy [facility] for tasks assessed/performed              Past Medical History:  Diagnosis Date   High cholesterol    Hypertension    Past Surgical History:  Procedure Laterality Date   DILATION AND CURETTAGE OF UTERUS     KNEE SURGERY Left 1996   TONSILLECTOMY AND ADENOIDECTOMY     Patient Active Problem List   Diagnosis Date Noted   Ovarian cyst 03/16/2014    PCP: Ileene Rubens  REFERRING PROVIDER: Ileene Rubens  REFERRING DIAG: M56.16  Rationale for Evaluation and Treatment Rehabilitation  THERAPY DIAG:  Other low back pain  Muscle weakness (generalized)  Radiculopathy, lumbar region  ONSET DATE: 10/13/21  SUBJECTIVE:                                                                                                                                                                                           SUBJECTIVE STATEMENT: "I Good" PAIN:  Are you having pain? Yes: NPRS scale: 0/10 Pain location: low back Pain description: achy Aggravating factors: sitting too long Relieving factors: pain meds, heating pads  PRECAUTIONS: None  WEIGHT BEARING RESTRICTIONS: No  FALLS:  Has patient fallen in last 6 months? Yes. Number of falls 1  LIVING ENVIRONMENT: Lives with: lives with their spouse Lives in: House/apartment Stairs: Yes: Internal: 30 steps; on right going up Has following equipment at home: None  OCCUPATION: N/A  PLOF: Independent  PATIENT GOALS: to get back to where I was   OBJECTIVE:   DIAGNOSTIC FINDINGS:  10/13/21- Slight left  curve of spine on X-ray  SCREENING FOR RED FLAGS: Bowel or bladder incontinence: No Spinal tumors: No Cauda equina syndrome: No Compression fracture: No Abdominal aneurysm: No  COGNITION: Overall cognitive status: Within functional limits for tasks assessed     SENSATION: WFL   POSTURE: rounded shoulders and forward head  PALPATION: "Discomfort" with palpation along lumbar spine  LUMBAR ROM:   AROM eval 12/22/21  Flexion WFL slight tightness WFL  Extension Mild tightness WFL  Right lateral flexion Tightness in L side WFL  Left lateral flexion Mild tightness WFL  Right  rotation [facility] WFL  Left rotation [facility] WFL   (Blank rows = not tested)  LOWER EXTREMITY ROM:   WFL   LOWER EXTREMITY MMT:    MMT Right eval Left eval  Hip flexion 5 5  Hip extension 4+ 4+  Hip abduction 4+ 4+  Hip adduction    Hip internal rotation 5 5  Hip external rotation 5 5  Knee flexion 4+ 4+  Knee extension 4+ 4+ discomfort in low back   Ankle dorsiflexion    Ankle plantarflexion    Ankle inversion    Ankle eversion     (Blank rows = not tested)  LUMBAR SPECIAL TESTS:  Straight leg raise test: Negative and FABER test: Positive  FUNCTIONAL TESTS:  5 times sit to stand: 10.84s   TODAY'S TREATMENT 01/01/22 NuStep L5 x 6 min  S2S OHP blue ball 2x10 Step Ups 6in w/ 4lb dumbbells x 10 each Hamstring curls 20lb 2x15 Leg Ext 5lb 2x10 AR Press 10lb x10 each  Standing shoulder Ext 10lb 2x10 Hip Ext & and 10lb x10 each  Seated rows 20# 2x10 Lat pull downs 20# 2x12     12/22/21 NuStep L5 x 6 min S2S OHP blue ball 2x10 Hamstring curls 20lb 2x12 Leg Ext 5lb 2x12 Shoulder Ext 10lb 2x10  Standing rows 10lb 2x10  Seated rows 20# 2x12 Lat pull downs 20# 2x12 LE on Pball bridges K2C, Oblq   12/17/21 NuStep L5 x 6 min  Seated rows 20# 2x12 Lat pull downs 20# 2x12 blackTB ext 2x10 Leg press 30# 2x10 Shoulder Ext 5lb 2x10  S2S OHP yellow 2x10 LE on Pball bridges K2C,  Oblq  12/10/21 Bike L3 x75mns  Leg press 20# 2x10 Seated rows 20# 2x10 Lat pull downs 20# 2x10 blackTB ext 2x10 Resisted gait 20# 4x 4 way   11/13/21 NuStep L5 x 6 min Hamstring curls 20lb 2x10 Leg Ext 10lb 2x10 S2S with OHP yellow 2x10  Shouldr Ext 5lb 2x10 Standing rows 10lb 2x10 Seated rows & Lats 20lb 2x10 Bridges x15  Feet on pball rotations, knees to chest, bridges  Stretches HS, piriformis, SK2C 30s each side  11/06/21 Nustep L5 x670ms  Stretches HS, piriformis, SK2C 30s each side Feet on pball rotations, knees to chest, bridges  Bridges with ball add 2x10  Sidelying clamshells greenTB 2x10 S2S with OHP yellow 2x10  Leg ext 10# 2x10 HS curls 20# 2x10 Calf raises on bar 2x12      10/27/21- Eval and HEP   PATIENT EDUCATION:  Education details: POC and HEP Person educated: Patient Education method: Explanation Education comprehension: verbalized understanding  HOME EXERCISE PROGRAM: Access Code: [email or web address]  Exercises - Supine Bridge  - 1 x daily - 7 x weekly - 2 sets - 10 reps - Clamshell with Resistance  - 1 x daily - 7 x weekly - 2 sets - 10 reps - Supine Lower Trunk Rotation  - 1 x daily - 7 x weekly - 2 sets - 10 reps  ASSESSMENT:  CLINICAL IMPRESSION: Again pt enters without pain,  No issues completing today's intervenous.  Cue for core engagement needed with standing shoulder extensions. Core weakness present with anti rotational presses. Some hip weakness noted with extensions on LLE. Pt is pleased with her current functional status, and elected to continue her care on her own.  REHAB POTENTIAL: Good  CLINICAL DECISION MAKING: Stable/uncomplicated  EVALUATION COMPLEXITY: Low  GOALS: Goals reviewed with patient? Yes  SHORT TERM GOALS: Target date: 12/01/21  Patient will  be independent with initial HEP.  Goal status: Met    LONG TERM GOALS: Target date: 01/05/22  Patient will be independent with advanced/ongoing HEP to improve outcomes and  carryover.  Goal status: INITIAL  2.  Patient will report 75% improvement in low back pain to improve QOL.  Baseline: 3/10 Goal status: Met 12/22/21   3.  Patient will demonstrate full pain free lumbar ROM to perform ADLs.   Goal status: Met 12/22/21  4.  Patient will demonstrate improved functional strength as demonstrated by 5/5. Goal status: INITIAL  PLAN:  PT FREQUENCY: 1x/week  PT DURATION: 10 weeks  PLANNED INTERVENTIONS: Therapeutic exercises, Therapeutic activity, Neuromuscular re-education, Balance training, Gait training, Patient/Family education, Self Care, Joint mobilization, Dry Needling, Electrical stimulation, Cryotherapy, Moist heat, Traction, Ionotophoresis 37m/ml Dexamethasone, and Manual therapy.  PLAN FOR NEXT SESSION: D/C PT  PHYSICAL THERAPY DISCHARGE SUMMARY  Visits from Start of Care: 7   Patient agrees to discharge. Patient goals were partially met. Patient is being discharged due to being pleased with the current functional level.   RScot Jun PTA 01/01/2022, 1:44 PM

## 2023-08-04 ENCOUNTER — Other Ambulatory Visit: Payer: Self-pay | Admitting: *Deleted

## 2023-08-04 DIAGNOSIS — I83819 Varicose veins of unspecified lower extremities with pain: Secondary | ICD-10-CM

## 2023-08-12 ENCOUNTER — Ambulatory Visit: Payer: Self-pay | Attending: Vascular Surgery | Admitting: Vascular Surgery

## 2023-08-12 ENCOUNTER — Ambulatory Visit (HOSPITAL_COMMUNITY)
Admission: RE | Admit: 2023-08-12 | Discharge: 2023-08-12 | Disposition: A | Discharge status: Home / Self Care | Payer: Self-pay | Source: Ambulatory Visit | Attending: Vascular Surgery | Admitting: Vascular Surgery

## 2023-08-12 ENCOUNTER — Encounter: Payer: Self-pay | Admitting: Vascular Surgery

## 2023-08-12 VITALS — BP 155/84 | HR 76 | Temp 98.1°F | Resp 18 | Ht 60.0 in | Wt 108.3 lb

## 2023-08-12 DIAGNOSIS — I83819 Varicose veins of unspecified lower extremities with pain: Secondary | ICD-10-CM | POA: Diagnosis present

## 2023-08-12 DIAGNOSIS — I872 Venous insufficiency (chronic) (peripheral): Secondary | ICD-10-CM | POA: Diagnosis not present

## 2023-08-12 DIAGNOSIS — I8391 Asymptomatic varicose veins of right lower extremity: Secondary | ICD-10-CM

## 2023-08-12 DIAGNOSIS — I8393 Asymptomatic varicose veins of bilateral lower extremities: Secondary | ICD-10-CM

## 2023-08-12 DIAGNOSIS — I83811 Varicose veins of right lower extremities with pain: Secondary | ICD-10-CM

## 2023-08-12 NOTE — Progress Notes (Signed)
 Office Note     CC: Right lower extremity varicose veins Requesting Provider:  Jolee Madelin Patch, MD  HPI: Cindy Barr is a 63 y.o. (December 19, 1960) female who presents at the request of Jolee Madelin Patch, MD for evaluation of right lower extremity varicose vein.   On exam, Cindy Barr was doing well, accompanied by her husband.  Originally from [place] , they moved to [place]  for his job in 1985, with no plans of returning.  Cindy Barr is a mother of 2.  She is very active, and works out on a daily basis.  Last year, she was shaving her legs and nicked a varicosity causing a significant bleeding.  This stopped with manual pressure.   She denies significant edema, leg heaviness.  She denies tired, achy, throbbing feeling by days end.  Since the bleeding episode, she has had no issues.  Denies pain with palpation. No history of vascular surgery procedure, no history of DVT.  She does not wear compression stockings.   Past Medical History:  Diagnosis Date   High cholesterol    Hypertension     Past Surgical History:  Procedure Laterality Date   DILATION AND CURETTAGE OF UTERUS     KNEE SURGERY Left 1996   TONSILLECTOMY AND ADENOIDECTOMY      Social History   Socioeconomic History   Marital status: Married    Spouse name: Not on file   Number of children: Not on file   Years of education: Not on file   Highest education level: Not on file  Occupational History   Not on file  Tobacco Use   Smoking status: Never   Smokeless tobacco: Not on file  Vaping Use   Vaping status: Never Used  Substance and Sexual Activity   Alcohol use: Yes    Comment: wine - once a week   Drug use: No   Sexual activity: Not Currently  Other Topics Concern   Not on file  Social History Narrative   Not on file   Social Drivers of Health   Financial Resource Strain: Not on file  Food Insecurity: Low Risk  (08/10/2023)   Received from [facility]   Hunger Vital Sign    Within  the past 12 months, you worried that your food would run out before you got money to buy more: Never true    Within the past 12 months, the food you bought just didn't last and you didn't have money to get more. : Never true  Transportation Needs: No Transportation Needs (08/10/2023)   Received from [facility]    In the past 12 months, has lack of reliable transportation kept you from medical appointments, meetings, work or from getting things needed for daily living? : No  Physical Activity: Not on file  Stress: Not on file  Social Connections: Not on file  Intimate Partner Violence: Not on file   Family History  Problem Relation Age of Onset   Lung cancer Mother    Breast cancer Mother    Ovarian cancer Maternal Aunt    Colon cancer Paternal Grandfather    Diabetes Father    Diabetes Sister    Diabetes Daughter    Diabetes Maternal Grandmother    Diabetes Paternal Grandmother     Current Outpatient Medications  Medication Sig Dispense Refill   Aspirin-Acetaminophen-Caffeine (EXCEDRIN PO) Take 1 tablet by mouth as needed.     atorvastatin (LIPITOR) 10 MG tablet Take 10 mg by mouth  daily at 6 PM.     busPIRone (BUSPAR) 15 MG tablet Take 15 mg by mouth 2 (two) times daily.      Fexofenadine HCl (MUCINEX ALLERGY PO) Take 1 tablet by mouth as needed.     nisoldipine (SULAR) 17 MG 24 hr tablet Take 17 mg by mouth daily.      No current facility-administered medications for this visit.    Allergies  Allergen Reactions   Amoxicillin Nausea Only   Penicillins Nausea Only   Sulfa Antibiotics Nausea Only     REVIEW OF SYSTEMS:  [X]  denotes positive finding, [ ]  denotes negative finding Cardiac  Comments:  Chest pain or chest pressure:    Shortness of breath upon exertion:    Short of breath when lying flat:    Irregular heart rhythm:        Vascular    Pain in calf, thigh, or hip brought on by ambulation:    Pain in feet at night that wakes you up from  your sleep:     Blood clot in your veins:    Leg swelling:         Pulmonary    Oxygen at home:    Productive cough:     Wheezing:         Neurologic    Sudden weakness in arms or legs:     Sudden numbness in arms or legs:     Sudden onset of difficulty speaking or slurred speech:    Temporary loss of vision in one eye:     Problems with dizziness:         Gastrointestinal    Blood in stool:     Vomited blood:         Genitourinary    Burning when urinating:     Blood in urine:        Psychiatric    Major depression:         Hematologic    Bleeding problems:    Problems with blood clotting too easily:        Skin    Rashes or ulcers:        Constitutional    Fever or chills:      PHYSICAL EXAMINATION:  Vitals:   08/12/23 1428  BP: (!) 155/84  Pulse: 76  Resp: 18  Temp: 98.1 F (36.7 C)  TempSrc: Temporal  SpO2: 91%  Weight: 108 lb 4.8 oz (49.1 kg)  Height: 5' (1.524 m)    General:  WDWN in NAD; vital signs documented above Gait: Not observed HENT: WNL, normocephalic Pulmonary: normal non-labored breathing , without Rales, rhonchi,  wheezing Cardiac: regular HR, Abdomen: soft, NT, no masses Skin: without rashes Vascular Exam/Pulses:  Right Left  Radial 2+ (normal) 2+ (normal)  Ulnar    Femoral    Popliteal    DP 2+ (normal) 2+ (normal)  PT     Extremities: without ischemic changes, without Gangrene , without cellulitis; without open wounds;  Musculoskeletal: no muscle wasting or atrophy  Neurologic: A&O X 3;  No focal weakness or paresthesias are detected Psychiatric:  The pt has Normal affect.   Non-Invasive Vascular Imaging:     Venous Reflux Times  +--------------+---------+------+-----------+------------+--------+  RIGHT        Reflux NoRefluxReflux TimeDiameter cmsComments                          Yes                                   +--------------+---------+------+-----------+------------+--------+  CFV                     yes   >1 second                       +--------------+---------+------+-----------+------------+--------+  FV mid                  yes   >1 second                       +--------------+---------+------+-----------+------------+--------+  Popliteal    no                                              +--------------+---------+------+-----------+------------+--------+  GSV at [facility]              yes    >500 ms      0.61              +--------------+---------+------+-----------+------------+--------+  GSV prox thigh          yes    >500 ms      0.56              +--------------+---------+------+-----------+------------+--------+  GSV mid thigh           yes    >500 ms      0.39              +--------------+---------+------+-----------+------------+--------+  GSV dist thigh          yes    >500 ms      0.48              +--------------+---------+------+-----------+------------+--------+  GSV at knee   no                            0.44              +--------------+---------+------+-----------+------------+--------+  GSV prox calf           yes    >500 ms      0.44              +--------------+---------+------+-----------+------------+--------+  SSV at [facility]    no                            0.10              +--------------+---------+------+-----------+------------+--------+      ASSESSMENT/PLAN:: 63 y.o. female presenting with superficial clusters of varicose veins in bilateral lower extremities, right greater than left.  On physical exam, Cindy Barr is a small female with thin dermis.  She has not had bleeding issues for a year, and now uses Nair rather than a razor which caused the bleeding event.  Lower extremity venous reflux ultrasound was reviewed demonstrating reflux throughout the greater saphenous vein as well as proximal deep system.  I had a long conversation with her regarding the above.  Being that she is  relatively asymptomatic, I think she would be best served with compression stockings.  She had another bleeding incident occur, we could surgically excise the cluster varicosities, however I do not think this is necessary at this time.  Furthermore, should she notice new onset edema, heaviness, we discussed the role of venous ablation.  Plan will be for compression and elevation at this time.  I asked her to call my office should any questions or concerns arise.    Fonda FORBES Rim, MD [facility] [phone]
